# Patient Record
Sex: Female | Born: 1975 | Race: Black or African American | Hispanic: No | Marital: Married | Smoking: Never smoker
Health system: Southern US, Community
[De-identification: ages and names within clinical notes are randomized; demographics above are authoritative.]

## PROBLEM LIST (undated history)

## (undated) DIAGNOSIS — Z789 Other specified health status: Secondary | ICD-10-CM

## (undated) DIAGNOSIS — K649 Unspecified hemorrhoids: Secondary | ICD-10-CM

## (undated) HISTORY — DX: Other specified health status: Z78.9

---

## 2020-07-22 NOTE — Congregational Nurse Program (Signed)
  Dept: [phone]   Congregational Nurse Program Note  Date of Encounter: 07/22/2020  Past Medical History: No past medical history on file.  Encounter Details: New refugee complaining of right shoulder and neck pain.Advised to use warm compresses and to do gentle exercises as well as over the counter pain medications.I will continue to monitor her pain and refer to pcp if needed.  Arman Bogus RN BSn P[facility]N  [facility] [phone]-cell [phone]-office

## 2020-08-12 NOTE — Congregational Nurse Program (Signed)
Patient came in today for BP check and for help setting up PCP appointment with Internal Medicine. She has upcoming appointment with health department on March 11th.   She also continues to have right sided neck pain as well as constipation associated with hemorrhoids.   Will call patient with appointment information. She will need to bring Medicaid Card, ID, and $3 to appointment.    Appointment scheduled for March 2nd, 10:15am.   Arman Bogus RN BSn [place] [facility]regational Nurse [phone]-cell [phone]-office

## 2020-08-20 ENCOUNTER — Encounter: Payer: Self-pay | Admitting: Internal Medicine

## 2020-08-20 ENCOUNTER — Ambulatory Visit (INDEPENDENT_AMBULATORY_CARE_PROVIDER_SITE_OTHER): Payer: Medicaid Other | Admitting: Internal Medicine

## 2020-08-20 VITALS — BP 110/71 | HR 80 | Ht 62.0 in | Wt 224.1 lb

## 2020-08-20 DIAGNOSIS — Z0000 Encounter for general adult medical examination without abnormal findings: Secondary | ICD-10-CM

## 2020-08-20 DIAGNOSIS — S46819A Strain of other muscles, fascia and tendons at shoulder and upper arm level, unspecified arm, initial encounter: Secondary | ICD-10-CM | POA: Insufficient documentation

## 2020-08-20 DIAGNOSIS — R202 Paresthesia of skin: Secondary | ICD-10-CM | POA: Insufficient documentation

## 2020-08-20 DIAGNOSIS — Z1231 Encounter for screening mammogram for malignant neoplasm of breast: Secondary | ICD-10-CM

## 2020-08-20 DIAGNOSIS — S46811A Strain of other muscles, fascia and tendons at shoulder and upper arm level, right arm, initial encounter: Secondary | ICD-10-CM | POA: Diagnosis not present

## 2020-08-20 NOTE — Assessment & Plan Note (Signed)
She has right trapezial soreness that is chronic and not relieved with ibuprofen or tylenol and has been present for more than three months. She is right handed. She has a two year old she doesn't carry a lot but when she does, she will carry her in either arm. The numbness and right trapezial pain do not interfere with getting work done, but do bother her frequently. Tenderness along right trapezius with stiffening.   - ice, heat, tylenol, ibuprofen  - she requests referral to PT  - f/u if symptoms fail to improve

## 2020-08-20 NOTE — Addendum Note (Signed)
Addended by: Bufford Spikes on: 08/20/2020 04:21 PM   Modules accepted: Orders

## 2020-08-20 NOTE — Progress Notes (Signed)
Internal Medicine Clinic Attending  Case discussed with Dr. Seawell  At the time of the visit.  We reviewed the resident's history and exam and pertinent patient test results.  I agree with the assessment, diagnosis, and plan of care documented in the resident's note.  

## 2020-08-20 NOTE — Patient Instructions (Addendum)
Thank you for allowing [place] to provide your care today. Today we discussed your shoulder pain, and tingling in your hands.    Please try to use a wrist splint for your wrists at night.   For your right shoulder, rest this as you can. Alternate ice and heat to the area.  You can also alternate tylenol 500 mg every 8 hours and ibuprofen 400 mg every 8 hours as needed.  Try to minimize Ibuprofen use.   Please follow-up for Pap smear in 1-2 weeks.   Please call the internal medicine center clinic if you have any questions or concerns, we may be able to help and keep you from a long and expensive emergency room wait. Our clinic and after hours phone number is [phone], the best time to call is Monday through Friday 9 am to 4 pm but there is always someone available 24/7 if you have an emergency. If you need medication refills please notify your pharmacy one week in advance and they will send [place] a request.

## 2020-08-20 NOTE — Assessment & Plan Note (Signed)
She has never had a pap smear that she knows of.   - f/u in 1-2 weeks for pap smear - referral sent for mammogram

## 2020-08-20 NOTE — Progress Notes (Signed)
   CC: hand tingling  HPI:  Allison Maddox is a 45 y.o. with PMH as below presenting to establish care  Please see A&P for assessment of the patient's acute and chronic medical conditions.   She has been having tingling in both hands for the past few months, left > right. It is worse at night before bed. The tingling is along the entire hand. She uses her hands a lot throughout the day with lots of lifting, washing dishes, writing. The tingling does not radiate up her arm and is localized to the hands.  She also has right trapezial soreness that is chronic and not relieved with ibuprofen or tylenol and has been present for more than three months. She is right handed. She has a two year old she doesn't carry a lot but when she does, she will carry her in either arm. The numbness and right trapezial pain do not interfere with getting work done, but do bother her frequently.    PMH: No medical history Surgical: c-section 2001, 2003, 2008, 2019.   Medications: She is not taking any medications  Social History: She has four children.  She lives in [place] since December 2021 from [place]  She does not use tobacco She does not drink.  Her children sometimes help at home.  She is here learning english currently.   No past medical history on file.  Review of Systems:   Review of Systems  Constitutional: Negative for chills, fever, malaise/fatigue and weight loss.  HENT: Negative for congestion and sore throat.   Respiratory: Negative for cough, shortness of breath and wheezing.   Cardiovascular: Negative for chest pain, palpitations and leg swelling.  Gastrointestinal: Negative for abdominal pain, constipation, diarrhea and nausea.  Genitourinary: Negative for dysuria, frequency and urgency.  Musculoskeletal: Positive for back pain, myalgias and neck pain. Negative for falls and joint pain.  Neurological: Positive for tingling. Negative for dizziness, focal weakness, weakness and  headaches.  Psychiatric/Behavioral: Negative for depression.   Physical Exam:  Constitution: NAD, appears stated age HENT: no occipital tenderness, [place]/AT  Cardio: RRR, no m/r/g, no LE edema  Respiratory: CTA, no w/r/r Abdominal: NTTP, soft, non-distended MSK: Negative Tinel's and phalen's, negative ulnar nerve test, negative spurling's. Negative allen test for thoracic outlet. Trapezius on right TTP, tight. Full active ROM shoulders bilaterally, strength 5/5 upper extremities bilaterally, sensation intact  Neuro: normal affect, a&ox3 Skin: c/d/i    Vitals:   08/20/20 1035  BP: 110/71  Pulse: 80  SpO2: 100%  Weight: 224 lb 1.6 oz (101.7 kg)  Height: 5\' 2"  (1.575 m)    Assessment & Plan:   See Encounters Tab for problem based charting.  Patient discussed with Dr. 

## 2020-08-20 NOTE — Assessment & Plan Note (Addendum)
She has been having tingling in both hands for the past few months, left > right. It is worse at night before bed. The tingling is along the entire hand. She uses her hands a lot throughout the day with lots of lifting, washing dishes, writing. The tingling does not radiate up her arm and is localized to the hands.  She also has right trapezial soreness that is chronic and not relieved with ibuprofen or tylenol and has been present for more than three months. She is right handed. She has a two year old she doesn't carry a lot but when she does, she will carry her in either arm. The numbness and right trapezial pain do not interfere with getting work done, but do bother her frequently. Negative Tinel's and phalen's, negative ulnar nerve test, negative spurling's. Negative allen test for thoracic outlet.  Symptoms may be  - CBC, BMP, A1C, vitamin B12 - ice, heat, tylenol, ibuprofen  - she requests referral to PT  - recommend wrist splints at night  - f/u if symptoms fail to improve

## 2020-08-26 ENCOUNTER — Encounter: Payer: Self-pay | Admitting: *Deleted

## 2020-08-28 NOTE — Progress Notes (Signed)
  Subjective:    Allison Maddox - 45 y.o. female MRN [id number]  Date of birth: [birth year]  HPI  Allison Maddox is to establish care. Patient has a PMH significant for none.    Current issues and/or concerns: - None  ROS per HPI   Health Maintenance:  Health Maintenance Due  Topic Date Due  . Hepatitis C Screening  Never done  . COVID-19 Vaccine (1) Never done  . HIV Screening  Never done  . TETANUS/TDAP  Never done  . PAP SMEAR-Modifier  Never done  . INFLUENZA VACCINE  Never done    Past Medical History: There are no problems to display for this patient.  Social History   reports that she has never smoked. She has never used smokeless tobacco. She reports that she does not drink alcohol and does not use drugs.   Family History  family history is not on file.   Medications: reviewed and updated   Objective:   Physical Exam BP 103/67 (BP Location: Left Arm, Patient Position: Sitting)   Pulse 69   Ht 5' 2.44" (1.586 m)   Wt 224 lb 3.2 oz (101.7 kg)   SpO2 98%   BMI 40.43 kg/m  Physical Exam Constitutional:      Appearance: She is obese.  HENT:     Head: Normocephalic and atraumatic.  Eyes:     Extraocular Movements: Extraocular movements intact.     Pupils: Pupils are equal, round, and reactive to light.  Cardiovascular:     Rate and Rhythm: Normal rate and regular rhythm.     Pulses: Normal pulses.     Heart sounds: Normal heart sounds.  Pulmonary:     Effort: Pulmonary effort is normal.     Breath sounds: Normal breath sounds.  Musculoskeletal:     Cervical back: Normal range of motion and neck supple.  Neurological:     General: No focal deficit present.     Mental Status: She is alert and oriented to person, place, and time.  Psychiatric:        Mood and Affect: Mood normal.        Behavior: Behavior normal.     Assessment & Plan:  1. Encounter to establish care: - Patient presents today to establish care.  - Return for annual physical  examination, labs, and health maintenance. Arrive fasting meaning having had no food and/or nothing to drink for at least 8 hours prior to appointment.  Please take scheduled medications as normal.  2. Language barrier: - [employer] participated during today's visit. Interpreter Name: Raynelle Fanning, ID#: [id number].   Patient was given clear instructions to go to Emergency Department or return to medical center if symptoms don't improve, worsen, or new problems develop.The patient verbalized understanding.  I discussed the assessment and treatment plan with the patient. The patient was provided an opportunity to ask questions and all were answered. The patient agreed with the plan and demonstrated an understanding of the instructions.   The patient was advised to call back or seek an in-person evaluation if the symptoms worsen or if the condition fails to improve as anticipated.    Ricky Stabs, NP 08/30/2020, 6:56 AM Primary Care at [facility]

## 2020-08-29 ENCOUNTER — Ambulatory Visit (INDEPENDENT_AMBULATORY_CARE_PROVIDER_SITE_OTHER): Payer: Medicaid Other | Admitting: Family

## 2020-08-29 ENCOUNTER — Other Ambulatory Visit: Payer: Self-pay

## 2020-08-29 ENCOUNTER — Encounter: Payer: Self-pay | Admitting: Family

## 2020-08-29 VITALS — BP 103/67 | HR 69 | Ht 62.44 in | Wt 224.2 lb

## 2020-08-29 DIAGNOSIS — Z789 Other specified health status: Secondary | ICD-10-CM

## 2020-08-29 DIAGNOSIS — Z7689 Persons encountering health services in other specified circumstances: Secondary | ICD-10-CM

## 2020-08-29 NOTE — Patient Instructions (Addendum)
Return for annual physical examination, labs, and health maintenance. Arrive fasting meaning having had no food and/or nothing to drink for at least 8 hours prior to appointment.  Please take scheduled medications as normal. Thank you for choosing Primary Care at [facility] for your medical home!    Allison Maddox was seen by Camillia Herter, NP today.   Allison Maddox's primary care provider is Camillia Herter, NP.   For the best care possible,  you should try to see Durene Fruits, NP whenever you come to clinic.   We look forward to seeing you again soon!  If you have any questions about your visit today,  please call us at [phone]  Or feel free to reach your provider via [place].    Preventive Care 51-63 Years Old, Female Preventive care refers to lifestyle choices and visits with your health care provider that can promote health and wellness. This includes:  A yearly physical exam. This is also called an annual wellness visit.  Regular dental and eye exams.  Immunizations.  Screening for certain conditions.  Healthy lifestyle choices, such as: ? Eating a healthy diet. ? Getting regular exercise. ? Not using drugs or products that contain nicotine and tobacco. ? Limiting alcohol use. What can I expect for my preventive care visit? Physical exam Your health care provider will check your:  Height and weight. These may be used to calculate your BMI (body mass index). BMI is a measurement that tells if you are at a healthy weight.  Heart rate and blood pressure.  Body temperature.  Skin for abnormal spots. Counseling Your health care provider may ask you questions about your:  Past medical problems.  Family's medical history.  Alcohol, tobacco, and drug use.  Emotional well-being.  Home life and relationship well-being.  Sexual activity.  Diet, exercise, and sleep habits.  Work and work Statistician.  Access to firearms.  Method of birth  control.  Menstrual cycle.  Pregnancy history. What immunizations do I need? Vaccines are usually given at various ages, according to a schedule. Your health care provider will recommend vaccines for you based on your age, medical history, and lifestyle or other factors, such as travel or where you work.   What tests do I need? Blood tests  Lipid and cholesterol levels. These may be checked every 5 years, or more often if you are over 31 years old.  Hepatitis C test.  Hepatitis B test. Screening  Lung cancer screening. You may have this screening every year starting at age 42 if you have a 30-pack-year history of smoking and currently smoke or have quit within the past 15 years.  Colorectal cancer screening. ? All adults should have this screening starting at age 31 and continuing until age 32. ? Your health care provider may recommend screening at age 52 if you are at increased risk. ? You will have tests every 1-10 years, depending on your results and the type of screening test.  Diabetes screening. ? This is done by checking your blood sugar (glucose) after you have not eaten for a while (fasting). ? You may have this done every 1-3 years.  Mammogram. ? This may be done every 1-2 years. ? Talk with your health care provider about when you should start having regular mammograms. This may depend on whether you have a family history of breast cancer.  BRCA-related cancer screening. This may be done if you have a family history of breast, ovarian, tubal,  or peritoneal cancers.  Pelvic exam and Pap test. ? This may be done every 3 years starting at age 58. ? Starting at age 77, this may be done every 5 years if you have a Pap test in combination with an HPV test. Other tests  STD (sexually transmitted disease) testing, if you are at risk.  Bone density scan. This is done to screen for osteoporosis. You may have this scan if you are at high risk for osteoporosis. Talk with your  health care provider about your test results, treatment options, and if necessary, the need for more tests. Follow these instructions at home: Eating and drinking  Eat a diet that includes fresh fruits and vegetables, whole grains, lean protein, and low-fat dairy products.  Take vitamin and mineral supplements as recommended by your health care provider.  Do not drink alcohol if: ? Your health care provider tells you not to drink. ? You are pregnant, may be pregnant, or are planning to become pregnant.  If you drink alcohol: ? Limit how much you have to 0-1 drink a day. ? Be aware of how much alcohol is in your drink. In the [place] one drink equals one 12 oz bottle of beer (355 mL), one 5 oz glass of wine (148 mL), or one 1 oz glass of hard liquor (44 mL).   Lifestyle  Take daily care of your teeth and gums. Brush your teeth every morning and night with fluoride toothpaste. Floss one time each day.  Stay active. Exercise for at least 30 minutes 5 or more days each week.  Do not use any products that contain nicotine or tobacco, such as cigarettes, e-cigarettes, and chewing tobacco. If you need help quitting, ask your health care provider.  Do not use drugs.  If you are sexually active, practice safe sex. Use a condom or other form of protection to prevent STIs (sexually transmitted infections).  If you do not wish to become pregnant, use a form of birth control. If you plan to become pregnant, see your health care provider for a prepregnancy visit.  If told by your health care provider, take low-dose aspirin daily starting at age 69.  Find healthy ways to cope with stress, such as: ? Meditation, yoga, or listening to music. ? Journaling. ? Talking to a trusted person. ? Spending time with friends and family. Safety  Always wear your seat belt while driving or riding in a vehicle.  Do not drive: ? If you have been drinking alcohol. Do not ride with someone who has been  drinking. ? When you are tired or distracted. ? While texting.  Wear a helmet and other protective equipment during sports activities.  If you have firearms in your house, make sure you follow all gun safety procedures. What's next?  Visit your health care provider once a year for an annual wellness visit.  Ask your health care provider how often you should have your eyes and teeth checked.  Stay up to date on all vaccines. This information is not intended to replace advice given to you by your health care provider. Make sure you discuss any questions you have with your health care provider. Document Revised: 03/11/2020 Document Reviewed: 02/16/2018 Elsevier Patient Education  2021 [employer].

## 2020-08-29 NOTE — Progress Notes (Signed)
Establish care Refugees 3 months

## 2020-09-01 ENCOUNTER — Encounter: Payer: Self-pay | Admitting: Internal Medicine

## 2020-09-03 ENCOUNTER — Encounter: Payer: Medicaid Other | Admitting: Internal Medicine

## 2020-09-12 ENCOUNTER — Other Ambulatory Visit: Payer: Self-pay

## 2020-09-12 ENCOUNTER — Ambulatory Visit (INDEPENDENT_AMBULATORY_CARE_PROVIDER_SITE_OTHER): Payer: Medicaid Other | Admitting: Family

## 2020-09-12 VITALS — BP 108/76 | HR 79 | Ht 62.44 in | Wt 223.8 lb

## 2020-09-12 DIAGNOSIS — Z130 Encounter for screening for diseases of the blood and blood-forming organs and certain disorders involving the immune mechanism: Secondary | ICD-10-CM | POA: Diagnosis not present

## 2020-09-12 DIAGNOSIS — Z131 Encounter for screening for diabetes mellitus: Secondary | ICD-10-CM | POA: Diagnosis not present

## 2020-09-12 DIAGNOSIS — Z1329 Encounter for screening for other suspected endocrine disorder: Secondary | ICD-10-CM

## 2020-09-12 DIAGNOSIS — Z124 Encounter for screening for malignant neoplasm of cervix: Secondary | ICD-10-CM

## 2020-09-12 DIAGNOSIS — R200 Anesthesia of skin: Secondary | ICD-10-CM

## 2020-09-12 DIAGNOSIS — Z1159 Encounter for screening for other viral diseases: Secondary | ICD-10-CM

## 2020-09-12 DIAGNOSIS — Z13220 Encounter for screening for lipoid disorders: Secondary | ICD-10-CM

## 2020-09-12 DIAGNOSIS — Z13228 Encounter for screening for other metabolic disorders: Secondary | ICD-10-CM

## 2020-09-12 DIAGNOSIS — Z789 Other specified health status: Secondary | ICD-10-CM

## 2020-09-12 DIAGNOSIS — Z0000 Encounter for general adult medical examination without abnormal findings: Secondary | ICD-10-CM | POA: Diagnosis not present

## 2020-09-12 DIAGNOSIS — K649 Unspecified hemorrhoids: Secondary | ICD-10-CM

## 2020-09-12 DIAGNOSIS — M542 Cervicalgia: Secondary | ICD-10-CM

## 2020-09-12 DIAGNOSIS — Z114 Encounter for screening for human immunodeficiency virus [HIV]: Secondary | ICD-10-CM

## 2020-09-12 MED ORDER — GABAPENTIN 100 MG PO CAPS: 100.0000 mg | ORAL_CAPSULE | Freq: Every day | ORAL | 0 refills | Status: DC

## 2020-09-12 MED ORDER — MELOXICAM 7.5 MG PO TABS: 7.5000 mg | ORAL_TABLET | Freq: Every day | ORAL | 0 refills | Status: DC

## 2020-09-12 MED ORDER — HYDROCORTISONE (PERIANAL) 2.5 % EX CREA: 1.0000 "application " | TOPICAL_CREAM | Freq: Two times a day (BID) | CUTANEOUS | 1 refills | Status: DC

## 2020-09-12 MED ORDER — GABAPENTIN 100 MG PO CAPS: 100.0000 mg | ORAL_CAPSULE | Freq: Every day | ORAL | 0 refills | Status: AC

## 2020-09-12 NOTE — Progress Notes (Signed)
Physical Hand/feet cramping for 3 weeks  Any prescriptions need to be printed-no pharmacy

## 2020-09-12 NOTE — Progress Notes (Signed)
Patient ID: Allison Maddox, female    DOB: 1975/12/22  MRN: [id number]  CC: Annual Exam  Subjective: Allison Maddox is a 45 y.o. female who presents for annual physical exam. Her concerns today include:   Bilateral hand numbness and neck pain. Both described as ongoing. Denies trauma and radiation of hands and neck.   Concern of feeling something at her anus. Denies dark stool and blood in stool. Reports having menses today and declined examination.    Patient Active Problem List   Diagnosis Date Noted  . Hand tingling 08/20/2020  . Trapezius muscle strain 08/20/2020  . Healthcare maintenance 08/20/2020     No current outpatient medications on file prior to visit.   No current facility-administered medications on file prior to visit.    No Known Allergies  Social History   Socioeconomic History  . Marital status: Married    Spouse name: Not on file  . Number of children: Not on file  . Years of education: Not on file  . Highest education level: Not on file  Occupational History  . Not on file  Tobacco Use  . Smoking status: Never Smoker  . Smokeless tobacco: Never Used  Vaping Use  . Vaping Use: Never used  Substance and Sexual Activity  . Alcohol use: Never  . Drug use: Never  . Sexual activity: Yes    Birth control/protection: None  Other Topics Concern  . Not on file  Social History Narrative   ** Merged History Encounter **       Social Determinants of Health   Financial Resource Strain: Not on file  Food Insecurity: Not on file  Transportation Needs: Not on file  Physical Activity: Not on file  Stress: Not on file  Social Connections: Not on file  Intimate Partner Violence: Not on file    No family history on file.  Past Surgical History:  Procedure Laterality Date  . CESAREAN SECTION  2001, 2003, 2008, and 2019  . CESAREAN SECTION      ROS: Review of Systems Negative except as stated above  PHYSICAL EXAM: BP 108/76 (BP Location:  Left Arm, Patient Position: Sitting)   Pulse 79   Ht 5' 2.44" (1.586 m)   Wt 223 lb 12.8 oz (101.5 kg)   SpO2 97%   BMI 40.36 kg/m    Wt Readings from Last 3 Encounters:  09/12/20 223 lb 12.8 oz (101.5 kg)  08/29/20 224 lb 3.2 oz (101.7 kg)  08/20/20 224 lb 1.6 oz (101.7 kg)    Physical Exam Exam conducted with a chaperone present.  Constitutional:      Appearance: She is obese.  HENT:     Head: Normocephalic and atraumatic.     Right Ear: Tympanic membrane, ear canal and external ear normal.     Left Ear: Tympanic membrane, ear canal and external ear normal.     Nose: Nose normal.     Mouth/Throat:     Mouth: Mucous membranes are moist.     Pharynx: Oropharynx is clear.  Eyes:     Extraocular Movements: Extraocular movements intact.     Conjunctiva/sclera: Conjunctivae normal.     Pupils: Pupils are equal, round, and reactive to light.  Cardiovascular:     Rate and Rhythm: Normal rate and regular rhythm.     Pulses: Normal pulses.     Heart sounds: Normal heart sounds.  Pulmonary:     Effort: Pulmonary effort is normal.     Breath  sounds: Normal breath sounds.  Chest:  Breasts:     Right: Normal.     Left: Normal.      Comments: Allison Maddox, CMA present during examination.  Abdominal:     General: Bowel sounds are normal.     Palpations: Abdomen is soft.  Genitourinary:    Comments: Patient declined examination.  Musculoskeletal:        General: Normal range of motion.     Cervical back: Normal range of motion and neck supple.  Skin:    General: Skin is warm and dry.     Capillary Refill: Capillary refill takes less than 2 seconds.  Neurological:     General: No focal deficit present.     Mental Status: She is alert and oriented to person, place, and time.  Psychiatric:        Mood and Affect: Mood normal.        Behavior: Behavior normal.     ASSESSMENT AND PLAN: 1. Annual physical exam: - Counseled on 150 minutes of exercise per week as  tolerated, healthy eating (including decreased daily intake of saturated fats, cholesterol, added sugars, sodium), STI prevention, and routine healthcare maintenance.  2. Screening for metabolic disorder: - CMP to check kidney function, liver function, and electrolyte balance.  - Comprehensive metabolic panel  3. Screening for deficiency anemia: - CBC to screen for anemia. - CBC  4. Diabetes mellitus screening: - Hemoglobin A1c to screen for pre-diabetes/diabetes. - Hemoglobin A1c  5. Screening cholesterol level: - Lipid panel to screen for high cholesterol.  - Lipid panel  6. Thyroid disorder screen: - TSH to check thyroid function.  - TSH+T4F+T3Free  7. Need for hepatitis C screening test: - Hepatitis C antibody to screen for hepatitis C.  - Hepatitis C Antibody  8. Encounter for screening for HIV: - HIV antibody to screen for human immunodeficiency virus.  - HIV antibody (with reflex)  9. Pap smear for cervical cancer screening: - Referral to Gynecology for cervical cancer screening by PAP smear.  - Ambulatory referral to Gynecology  10. Hand numbness: - Gabapentin as prescribed.  -Follow-up with primary provider as scheduled. - gabapentin (NEURONTIN) 100 MG capsule; Take 1 capsule (100 mg total) by mouth at bedtime.  Dispense: 30 capsule; Refill: 0  11. Neck pain: - Meloxicam as prescribed.  - Follow-up with primary provider as scheduled.  - meloxicam (MOBIC) 7.5 MG tablet; Take 1 tablet (7.5 mg total) by mouth daily.  Dispense: 30 tablet; Refill: 0  12. Hemorrhoids, unspecified hemorrhoid type: - Hydrocortisone rectal cream as prescribed.  - Follow-up with primary provider as scheduled. - hydrocortisone (ANUSOL-HC) 2.5 % rectal cream; Place 1 application rectally 2 (two) times daily.  Dispense: 30 g; Refill: 1  13. Language barrier: - [employer] participated during today's visit. Interpreter Name: Truman Hayward, ID#: [id number].   Patient was given the  opportunity to ask questions.  Patient verbalized understanding of the plan and was able to repeat key elements of the plan. Patient was given clear instructions to go to Emergency Department or return to medical center if symptoms don't improve, worsen, or new problems develop.The patient verbalized understanding.   Orders Placed This Encounter  Procedures  . Hepatitis C Antibody  . HIV antibody (with reflex)  . CBC  . Comprehensive metabolic panel  . Lipid panel  . TSH+T4F+T3Free  . Hemoglobin A1c  . Ambulatory referral to Gynecology    Requested Prescriptions   Signed Prescriptions Disp Refills  . gabapentin (  NEURONTIN) 100 MG capsule 30 capsule 0    Sig: Take 1 capsule (100 mg total) by mouth at bedtime.  . meloxicam (MOBIC) 7.5 MG tablet 30 tablet 0    Sig: Take 1 tablet (7.5 mg total) by mouth daily.  . hydrocortisone (ANUSOL-HC) 2.5 % rectal cream 30 g 1    Sig: Place 1 application rectally 2 (two) times daily.    Follow-up with primary provider as scheduled.  [name], NP

## 2020-09-12 NOTE — Patient Instructions (Addendum)
Annual physical exam and labs today.   Meloxicam for neck pain.  Gabapentin for hand pain.   Anusol for hemorrhoids.   Follow-up with primary provider as scheduled.   Preventive Care 27-44 Years Old, Female Preventive care refers to lifestyle choices and visits with your health care provider that can promote health and wellness. This includes:  A yearly physical exam. This is also called an annual wellness visit.  Regular dental and eye exams.  Immunizations.  Screening for certain conditions.  Healthy lifestyle choices, such as: ? Eating a healthy diet. ? Getting regular exercise. ? Not using drugs or products that contain nicotine and tobacco. ? Limiting alcohol use. What can I expect for my preventive care visit? Physical exam Your health care provider will check your:  Height and weight. These may be used to calculate your BMI (body mass index). BMI is a measurement that tells if you are at a healthy weight.  Heart rate and blood pressure.  Body temperature.  Skin for abnormal spots. Counseling Your health care provider may ask you questions about your:  Past medical problems.  Family's medical history.  Alcohol, tobacco, and drug use.  Emotional well-being.  Home life and relationship well-being.  Sexual activity.  Diet, exercise, and sleep habits.  Work and work Statistician.  Access to firearms.  Method of birth control.  Menstrual cycle.  Pregnancy history. What immunizations do I need? Vaccines are usually given at various ages, according to a schedule. Your health care provider will recommend vaccines for you based on your age, medical history, and lifestyle or other factors, such as travel or where you work.   What tests do I need? Blood tests  Lipid and cholesterol levels. These may be checked every 5 years, or more often if you are over 81 years old.  Hepatitis C test.  Hepatitis B test. Screening  Lung cancer screening. You may  have this screening every year starting at age 63 if you have a 30-pack-year history of smoking and currently smoke or have quit within the past 15 years.  Colorectal cancer screening. ? All adults should have this screening starting at age 55 and continuing until age 22. ? Your health care provider may recommend screening at age 25 if you are at increased risk. ? You will have tests every 1-10 years, depending on your results and the type of screening test.  Diabetes screening. ? This is done by checking your blood sugar (glucose) after you have not eaten for a while (fasting). ? You may have this done every 1-3 years.  Mammogram. ? This may be done every 1-2 years. ? Talk with your health care provider about when you should start having regular mammograms. This may depend on whether you have a family history of breast cancer.  BRCA-related cancer screening. This may be done if you have a family history of breast, ovarian, tubal, or peritoneal cancers.  Pelvic exam and Pap test. ? This may be done every 3 years starting at age 34. ? Starting at age 18, this may be done every 5 years if you have a Pap test in combination with an HPV test. Other tests  STD (sexually transmitted disease) testing, if you are at risk.  Bone density scan. This is done to screen for osteoporosis. You may have this scan if you are at high risk for osteoporosis. Talk with your health care provider about your test results, treatment options, and if necessary, the need for more  tests. Follow these instructions at home: Eating and drinking  Eat a diet that includes fresh fruits and vegetables, whole grains, lean protein, and low-fat dairy products.  Take vitamin and mineral supplements as recommended by your health care provider.  Do not drink alcohol if: ? Your health care provider tells you not to drink. ? You are pregnant, may be pregnant, or are planning to become pregnant.  If you drink  alcohol: ? Limit how much you have to 0-1 drink a day. ? Be aware of how much alcohol is in your drink. In the [place] one drink equals one 12 oz bottle of beer (355 mL), one 5 oz glass of wine (148 mL), or one 1 oz glass of hard liquor (44 mL).   Lifestyle  Take daily care of your teeth and gums. Brush your teeth every morning and night with fluoride toothpaste. Floss one time each day.  Stay active. Exercise for at least 30 minutes 5 or more days each week.  Do not use any products that contain nicotine or tobacco, such as cigarettes, e-cigarettes, and chewing tobacco. If you need help quitting, ask your health care provider.  Do not use drugs.  If you are sexually active, practice safe sex. Use a condom or other form of protection to prevent STIs (sexually transmitted infections).  If you do not wish to become pregnant, use a form of birth control. If you plan to become pregnant, see your health care provider for a prepregnancy visit.  If told by your health care provider, take low-dose aspirin daily starting at age 52.  Find healthy ways to cope with stress, such as: ? Meditation, yoga, or listening to music. ? Journaling. ? Talking to a trusted person. ? Spending time with friends and family. Safety  Always wear your seat belt while driving or riding in a vehicle.  Do not drive: ? If you have been drinking alcohol. Do not ride with someone who has been drinking. ? When you are tired or distracted. ? While texting.  Wear a helmet and other protective equipment during sports activities.  If you have firearms in your house, make sure you follow all gun safety procedures. What's next?  Visit your health care provider once a year for an annual wellness visit.  Ask your health care provider how often you should have your eyes and teeth checked.  Stay up to date on all vaccines. This information is not intended to replace advice given to you by your health care provider. Make  sure you discuss any questions you have with your health care provider. Document Revised: 03/11/2020 Document Reviewed: 02/16/2018 Elsevier Patient Education  2021 [employer].

## 2020-09-13 LAB — COMPREHENSIVE METABOLIC PANEL
ALT: 13 IU/L (ref 0–32)
AST: 21 IU/L (ref 0–40)
Albumin/Globulin Ratio: 1.3 (ref 1.2–2.2)
Albumin: 4 g/dL (ref 3.8–4.8)
Alkaline Phosphatase: 40 IU/L — ABNORMAL LOW (ref 44–121)
BUN/Creatinine Ratio: 20 (ref 9–23)
BUN: 14 mg/dL (ref 6–24)
Bilirubin Total: <0.2 mg/dL (ref 0.0–1.2)
CO2: 20 mmol/L (ref 20–29)
Calcium: 9.4 mg/dL (ref 8.7–10.2)
Chloride: 107 mmol/L — ABNORMAL HIGH (ref 96–106)
Creatinine, Ser: 0.7 mg/dL (ref 0.57–1.00)
Globulin, Total: 3 g/dL (ref 1.5–4.5)
Glucose: 80 mg/dL (ref 65–99)
Potassium: 4.7 mmol/L (ref 3.5–5.2)
Sodium: 143 mmol/L (ref 134–144)
Total Protein: 7 g/dL (ref 6.0–8.5)
eGFR: 110 mL/min/{1.73_m2} (ref >59)

## 2020-09-13 LAB — HIV ANTIBODY (ROUTINE TESTING W REFLEX): HIV Screen 4th Generation wRfx: NONREACTIVE

## 2020-09-13 LAB — CBC
Hematocrit: 38.9 % (ref 34.0–46.6)
Hemoglobin: 12.4 g/dL (ref 11.1–15.9)
MCH: 26.4 pg — ABNORMAL LOW (ref 26.6–33.0)
MCHC: 31.9 g/dL (ref 31.5–35.7)
MCV: 83 fL (ref 79–97)
Platelets: 262 10*3/uL (ref 150–450)
RBC: 4.69 x10E6/uL (ref 3.77–5.28)
RDW: 14.6 % (ref 11.7–15.4)
WBC: 7.2 10*3/uL (ref 3.4–10.8)

## 2020-09-13 LAB — HEMOGLOBIN A1C
Est. average glucose Bld gHb Est-mCnc: 117 mg/dL
Hgb A1c MFr Bld: 5.7 % — ABNORMAL HIGH (ref 4.8–5.6)

## 2020-09-13 LAB — LIPID PANEL
Chol/HDL Ratio: 3 ratio (ref 0.0–4.4)
Cholesterol, Total: 207 mg/dL — ABNORMAL HIGH (ref 100–199)
HDL: 69 mg/dL (ref >39)
LDL Chol Calc (NIH): 127 mg/dL — ABNORMAL HIGH (ref 0–99)
Triglycerides: 63 mg/dL (ref 0–149)
VLDL Cholesterol Cal: 11 mg/dL (ref 5–40)

## 2020-09-13 LAB — TSH+T4F+T3FREE
Free T4: 1.03 ng/dL (ref 0.82–1.77)
T3, Free: 2.7 pg/mL (ref 2.0–4.4)
TSH: 1.7 u[IU]/mL (ref 0.450–4.500)

## 2020-09-13 LAB — HEPATITIS C ANTIBODY: Hep C Virus Ab: 0.1 s/co ratio (ref 0.0–0.9)

## 2020-09-13 NOTE — Progress Notes (Signed)
Kidney function normal.   Liver function normal.   Thyroid normal.   Hepatitis C negative.   HIV negative.   No anemia.  Your hemoglobin A1c is consistent with pre-diabetes. Practice healthy eating habits of fresh fruit and vegetables, lean baked meats such as chicken, fish, and [place]; limit breads, rice, pastas, and desserts; practice regular aerobic exercise (at least 150 minutes a week as tolerated). Patient encouraged to have rechecked in 6 months or sooner if needed.   Cholesterol higher than expected. High cholesterol may increase risk of heart attack and/or stroke. Consider eating more fruits, vegetables, and lean baked meats such as chicken or fish. Moderate intensity exercise at least 150 minutes as tolerated per week may help as well. Patient encouraged to have rechecked in 6 months or sooner if needed.  The following is for provider reference only: The 10-year ASCVD risk score Denman George DC Montez Hageman., et al., 2013) is: 0.2%   Values used to calculate the score:     Age: 45 years     Sex: Female     Is Non-Hispanic African American: Yes     Diabetic: No     Tobacco smoker: No     Systolic Blood Pressure: 108 mmHg     Is BP treated: No     HDL Cholesterol: 69 mg/dL     Total Cholesterol: 207 mg/dL

## 2020-09-24 NOTE — Addendum Note (Signed)
Addended by: Neomia Dear on: 09/24/2020 04:04 PM   Modules accepted: Orders

## 2020-10-02 ENCOUNTER — Ambulatory Visit
Admission: RE | Admit: 2020-10-02 | Discharge: 2020-10-02 | Disposition: A | Discharge status: Home / Self Care | Payer: Self-pay | Source: Ambulatory Visit | Attending: Obstetrics and Gynecology | Admitting: Obstetrics and Gynecology

## 2020-10-02 ENCOUNTER — Other Ambulatory Visit: Payer: Self-pay

## 2020-10-02 ENCOUNTER — Other Ambulatory Visit: Payer: Self-pay | Admitting: Obstetrics and Gynecology

## 2020-10-02 DIAGNOSIS — R7611 Nonspecific reaction to tuberculin skin test without active tuberculosis: Secondary | ICD-10-CM

## 2020-10-08 ENCOUNTER — Encounter: Payer: Self-pay | Admitting: Student

## 2020-10-08 ENCOUNTER — Ambulatory Visit (INDEPENDENT_AMBULATORY_CARE_PROVIDER_SITE_OTHER): Payer: Medicaid Other | Admitting: Student

## 2020-10-08 ENCOUNTER — Other Ambulatory Visit: Payer: Self-pay

## 2020-10-08 NOTE — Progress Notes (Signed)
Patient presents for pap smear but is bleeding. Will cancel this visit & reschedule for next week.   Judeth Horn, NP

## 2020-10-13 ENCOUNTER — Ambulatory Visit
Admission: RE | Admit: 2020-10-13 | Discharge: 2020-10-13 | Disposition: A | Discharge status: Home / Self Care | Payer: No Typology Code available for payment source | Source: Ambulatory Visit | Attending: Obstetrics and Gynecology | Admitting: Obstetrics and Gynecology

## 2020-10-13 ENCOUNTER — Other Ambulatory Visit: Payer: Self-pay | Admitting: Obstetrics and Gynecology

## 2020-10-13 DIAGNOSIS — R9389 Abnormal findings on diagnostic imaging of other specified body structures: Secondary | ICD-10-CM

## 2020-10-17 ENCOUNTER — Telehealth: Payer: Self-pay | Admitting: Certified Nurse Midwife

## 2020-10-17 ENCOUNTER — Ambulatory Visit: Payer: No Typology Code available for payment source | Admitting: Obstetrics and Gynecology

## 2020-10-17 NOTE — Telephone Encounter (Signed)
Attempted to reach patient about her appointment. Left a voice message with Interpreter [phone]. Then spoke to her husband, and left new appointment with him.

## 2020-10-31 ENCOUNTER — Encounter: Payer: Self-pay | Admitting: Certified Nurse Midwife

## 2020-10-31 ENCOUNTER — Ambulatory Visit (INDEPENDENT_AMBULATORY_CARE_PROVIDER_SITE_OTHER): Payer: Medicaid Other | Admitting: Certified Nurse Midwife

## 2020-10-31 ENCOUNTER — Other Ambulatory Visit: Payer: Self-pay

## 2020-10-31 ENCOUNTER — Other Ambulatory Visit (HOSPITAL_COMMUNITY)
Admission: RE | Admit: 2020-10-31 | Discharge: 2020-10-31 | Disposition: A | Discharge status: Home / Self Care | Payer: Medicaid Other | Source: Ambulatory Visit | Attending: Certified Nurse Midwife | Admitting: Certified Nurse Midwife

## 2020-10-31 VITALS — BP 130/67 | HR 92 | Temp 98.1°F | Ht 64.96 in | Wt 224.0 lb

## 2020-10-31 DIAGNOSIS — Z124 Encounter for screening for malignant neoplasm of cervix: Secondary | ICD-10-CM | POA: Insufficient documentation

## 2020-10-31 DIAGNOSIS — Z01419 Encounter for gynecological examination (general) (routine) without abnormal findings: Secondary | ICD-10-CM | POA: Diagnosis not present

## 2020-10-31 NOTE — Progress Notes (Signed)
GYNECOLOGY CLINIC ANNUAL PREVENTATIVE CARE ENCOUNTER NOTE  Subjective:   Allison Maddox is a 45 y.o. [phone] female here for a routine annual gynecologic exam.  Current complaints: None. Denies abnormal vaginal bleeding, discharge, pelvic pain, problems with intercourse or other gynecologic concerns.    [place] interpreter present via video for interpretation services, but pt speaks and understands a good amount of [place].  Gynecologic History Patient's last menstrual period was 10/07/2020 (exact date). Contraception: NFP Last Pap: 2019. Results were: normal Last mammogram: None, ordered by PCP  Obstetric History OB History  Gravida Para Term Preterm AB Living  4 3 3     4   SAB IAB Ectopic Multiple Live Births          4    # Outcome Date GA Lbr Len/2nd Weight Sex Delivery Anes PTL Lv  4 Term 09/29/17 [redacted]w[redacted]d  6 lb 2.8 oz (2.8 kg) F CS-LTranv   LIV  3 Term 06/12/07 [redacted]w[redacted]d  7 lb 15 oz (3.6 kg) M CS-LTranv   LIV  2 Term 09/06/01   8 lb 6 oz (3.8 kg) M CS-LTranv   LIV  1 Gravida 02/01/00   8 lb 4.3 oz (3.75 kg) M CS-LTranv   LIV   Past Medical History:  Diagnosis Date  . No pertinent past medical history    Past Surgical History:  Procedure Laterality Date  . CESAREAN SECTION  2001, 2003, 2008, and 2019  . CESAREAN SECTION     Current Outpatient Medications on File Prior to Visit  Medication Sig Dispense Refill  . gabapentin (NEURONTIN) 100 MG capsule Take 1 capsule (100 mg total) by mouth at bedtime. (Patient not taking: Reported on 10/08/2020) 30 capsule 0  . hydrocortisone (ANUSOL-HC) 2.5 % rectal cream Place 1 application rectally 2 (two) times daily. (Patient not taking: Reported on 10/08/2020) 30 g 1  . meloxicam (MOBIC) 7.5 MG tablet Take 1 tablet (7.5 mg total) by mouth daily. (Patient not taking: Reported on 10/08/2020) 30 tablet 0   No current facility-administered medications on file prior to visit.   No Known Allergies  Social History   Socioeconomic History   . Marital status: Married    Spouse name: Not on file  . Number of children: Not on file  . Years of education: Not on file  . Highest education level: Not on file  Occupational History  . Not on file  Tobacco Use  . Smoking status: Never Smoker  . Smokeless tobacco: Never Used  Vaping Use  . Vaping Use: Never used  Substance and Sexual Activity  . Alcohol use: Never  . Drug use: Never  . Sexual activity: Not Currently    Birth control/protection: None  Other Topics Concern  . Not on file  Social History Narrative   ** Merged History Encounter **       Social Determinants of Health   Financial Resource Strain: Not on file  Food Insecurity: Not on file  Transportation Needs: Not on file  Physical Activity: Not on file  Stress: Not on file  Social Connections: Not on file  Intimate Partner Violence: Not on file   No family history on file.  The following portions of the patient's history were reviewed and updated as appropriate: allergies, current medications, past family history, past medical history, past social history, past surgical history and problem list.  Review of Systems Pertinent items noted in HPI and remainder of comprehensive ROS otherwise negative.   Objective:  BP 130/67 (  BP Location: Left Arm, Patient Position: Sitting, Cuff Size: Large)   Pulse 92   Temp 98.1 F (36.7 C) (Oral)   Ht 5' 4.96" (1.65 m)   Wt 224 lb (101.6 kg)   LMP 10/07/2020 (Exact Date)   BMI 37.32 kg/m  CONSTITUTIONAL: Well-developed, well-nourished female in no acute distress.  HENT:  Normocephalic, atraumatic, External right and left ear normal. Oropharynx is clear and moist EYES: Conjunctivae and EOM are normal. Pupils are equal, round, and reactive to light. No scleral icterus.  NECK: Normal range of motion, supple, no masses.  Normal thyroid.  SKIN: Skin is warm and dry. No rash noted. Not diaphoretic. No erythema. No pallor. NEUROLGIC: Alert and oriented to person, place,  and time. Normal reflexes, muscle tone coordination. No cranial nerve deficit noted. PSYCHIATRIC: Normal mood and affect. Normal behavior. Normal judgment and thought content. CARDIOVASCULAR: Normal heart rate noted, regular rhythm RESPIRATORY: Clear to auscultation bilaterally. Effort and breath sounds normal, no problems with respiration noted. BREASTS: Symmetric in size. No masses, skin changes, nipple drainage, or lymphadenopathy. ABDOMEN: Soft, normal bowel sounds, no distention noted.  No tenderness, rebound or guarding.  PELVIC: Normal appearing external genitalia; normal appearing vaginal mucosa and cervix.  No abnormal discharge noted.  Pap smear obtained.  Normal uterine size, no other palpable masses, no uterine or adnexal tenderness. MUSCULOSKELETAL: Normal range of motion. No tenderness.  No cyanosis, clubbing, or edema.  2+ distal pulses.  Assessment:  Annual gynecologic examination with pap smear   Plan:  Will follow up results of pap smear and manage accordingly. Mammogram ordered Routine preventative health maintenance measures emphasized. Please refer to After Visit Summary for other counseling recommendations.    Bernerd Limbo, CNM

## 2020-11-05 LAB — CYTOLOGY - PAP
Comment: NEGATIVE
Diagnosis: NEGATIVE
High risk HPV: NEGATIVE

## 2021-05-04 ENCOUNTER — Ambulatory Visit (INDEPENDENT_AMBULATORY_CARE_PROVIDER_SITE_OTHER): Payer: Medicaid Other

## 2021-05-04 ENCOUNTER — Encounter: Payer: Self-pay | Admitting: Nurse Practitioner

## 2021-05-04 ENCOUNTER — Ambulatory Visit (INDEPENDENT_AMBULATORY_CARE_PROVIDER_SITE_OTHER): Payer: Medicaid Other | Admitting: Nurse Practitioner

## 2021-05-04 ENCOUNTER — Other Ambulatory Visit: Payer: Self-pay

## 2021-05-04 ENCOUNTER — Other Ambulatory Visit: Payer: Self-pay | Admitting: Family

## 2021-05-04 VITALS — BP 94/89 | HR 87

## 2021-05-04 DIAGNOSIS — R0781 Pleurodynia: Secondary | ICD-10-CM

## 2021-05-04 DIAGNOSIS — M25562 Pain in left knee: Secondary | ICD-10-CM | POA: Diagnosis not present

## 2021-05-04 DIAGNOSIS — M25512 Pain in left shoulder: Secondary | ICD-10-CM | POA: Diagnosis not present

## 2021-05-04 DIAGNOSIS — Z124 Encounter for screening for malignant neoplasm of cervix: Secondary | ICD-10-CM

## 2021-05-04 MED ORDER — NAPROXEN 500 MG PO TABS: 500.0000 mg | ORAL_TABLET | Freq: Two times a day (BID) | ORAL | 0 refills | Status: AC

## 2021-05-04 MED ORDER — TIZANIDINE HCL 4 MG PO TABS: 4.0000 mg | ORAL_TABLET | Freq: Four times a day (QID) | ORAL | 0 refills | Status: AC | PRN
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
## 2021-05-04 NOTE — Assessment & Plan Note (Signed)
Left shoulder pain Left rib pain Left knee pain:  Will order muscle relaxer  Will order Prescription naproxen  May alternate heat and ice  Follow up:  Follow up if needed

## 2021-05-11 ENCOUNTER — Telehealth: Payer: Self-pay | Admitting: *Deleted

## 2021-05-11 NOTE — Telephone Encounter (Signed)
-----   Message from Ivonne Andrew, NP sent at 05/11/2021  8:54 AM EST ----- Please try to call Patient using [place] interpreter with results. I have attempted to call, but have not been able to reach her. 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 Please have her follow up if pain persist because she may need referral to ortho. Thanks.

## 2021-05-11 NOTE — Telephone Encounter (Signed)
Medical Assistant used [employer] to contact patient.  Interpreter Name: Kathy Breach Interpreter #: [id number] Per signed DPR patient is aware via VM that xrays showed no new changes. Patient aware of being referred to ortho if pain persist. Patient advised to return a call to [phone].
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
## 2021-05-20 ENCOUNTER — Ambulatory Visit: Payer: Medicaid Other | Admitting: Family

## 2021-09-19 ENCOUNTER — Emergency Department (HOSPITAL_COMMUNITY)
Admission: EM | Admit: 2021-09-19 | Discharge: 2021-09-19 | Disposition: A | Discharge status: Home / Self Care | Payer: Medicaid Other | Attending: Emergency Medicine | Admitting: Emergency Medicine

## 2021-09-19 ENCOUNTER — Encounter (HOSPITAL_COMMUNITY): Payer: Self-pay | Admitting: Emergency Medicine

## 2021-09-19 ENCOUNTER — Emergency Department (HOSPITAL_COMMUNITY): Payer: Medicaid Other

## 2021-09-19 ENCOUNTER — Encounter: Payer: Self-pay | Admitting: Emergency Medicine

## 2021-09-19 ENCOUNTER — Other Ambulatory Visit: Payer: Self-pay

## 2021-09-19 ENCOUNTER — Ambulatory Visit
Admission: EM | Admit: 2021-09-19 | Discharge: 2021-09-19 | Disposition: A | Discharge status: Home / Self Care | Payer: Medicaid Other

## 2021-09-19 DIAGNOSIS — K6289 Other specified diseases of anus and rectum: Secondary | ICD-10-CM

## 2021-09-19 DIAGNOSIS — K649 Unspecified hemorrhoids: Secondary | ICD-10-CM | POA: Diagnosis not present

## 2021-09-19 HISTORY — DX: Unspecified hemorrhoids: K64.9

## 2021-09-19 LAB — CBC WITH DIFFERENTIAL/PLATELET
Abs Immature Granulocytes: 0 10*3/uL (ref 0.00–0.07)
Basophils Absolute: 0.1 10*3/uL (ref 0.0–0.1)
Basophils Relative: 1 %
Eosinophils Absolute: 0.2 10*3/uL (ref 0.0–0.5)
Eosinophils Relative: 3 %
HCT: 38.7 % (ref 36.0–46.0)
Hemoglobin: 12.4 g/dL (ref 12.0–15.0)
Lymphocytes Relative: 40 %
Lymphs Abs: 3 10*3/uL (ref 0.7–4.0)
MCH: 26.8 pg (ref 26.0–34.0)
MCHC: 32 g/dL (ref 30.0–36.0)
MCV: 83.8 fL (ref 80.0–100.0)
Monocytes Absolute: 0.3 10*3/uL (ref 0.1–1.0)
Monocytes Relative: 4 %
Neutro Abs: 3.8 10*3/uL (ref 1.7–7.7)
Neutrophils Relative %: 52 %
Platelets: 249 10*3/uL (ref 150–400)
RBC: 4.62 MIL/uL (ref 3.87–5.11)
RDW: 14.6 % (ref 11.5–15.5)
WBC: 7.4 10*3/uL (ref 4.0–10.5)
nRBC: 0 % (ref 0.0–0.2)
nRBC: 0 /100 WBC

## 2021-09-19 LAB — COMPREHENSIVE METABOLIC PANEL
ALT: 12 U/L (ref 0–44)
AST: 19 U/L (ref 15–41)
Albumin: 3.7 g/dL (ref 3.5–5.0)
Alkaline Phosphatase: 30 U/L — ABNORMAL LOW (ref 38–126)
Anion gap: 5 (ref 5–15)
BUN: 12 mg/dL (ref 6–20)
CO2: 24 mmol/L (ref 22–32)
Calcium: 9.4 mg/dL (ref 8.9–10.3)
Chloride: 108 mmol/L (ref 98–111)
Creatinine, Ser: 0.77 mg/dL (ref 0.44–1.00)
GFR, Estimated: >60 mL/min (ref >60)
Glucose, Bld: 89 mg/dL (ref 70–99)
Potassium: 3.9 mmol/L (ref 3.5–5.1)
Sodium: 137 mmol/L (ref 135–145)
Total Bilirubin: 0.5 mg/dL (ref 0.3–1.2)
Total Protein: 6.9 g/dL (ref 6.5–8.1)

## 2021-09-19 LAB — I-STAT BETA HCG BLOOD, ED (MC, WL, AP ONLY): I-stat hCG, quantitative: <5 m[IU]/mL (ref <5)

## 2021-09-19 LAB — LIPASE, BLOOD: Lipase: 36 U/L (ref 11–51)

## 2021-09-19 MED ORDER — LIDOCAINE HCL URETHRAL/MUCOSAL 2 % EX GEL
1.0000 "application " | Freq: Once | CUTANEOUS | Status: AC
  Administered 2021-09-19: 1 via TOPICAL
  Filled 2021-09-19: qty 11

## 2021-09-19 MED ORDER — HYDROCORTISONE (PERIANAL) 2.5 % EX CREA: 1.0000 "application " | TOPICAL_CREAM | Freq: Two times a day (BID) | CUTANEOUS | 1 refills | Status: AC

## 2021-09-19 MED ORDER — POLYETHYLENE GLYCOL 3350 17 GM/SCOOP PO POWD
17.0000 g | Freq: Every day | ORAL | 0 refills | Status: AC
Start: 2021-09-19 — End: ?

## 2021-09-19 MED ORDER — IOHEXOL 300 MG/ML  SOLN
100.0000 mL | Freq: Once | INTRAMUSCULAR | Status: AC | PRN
  Administered 2021-09-19: 100 mL via INTRAVENOUS
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
## 2021-09-19 NOTE — ED Triage Notes (Signed)
C/o hemorrhoid pain since moving to [place] 1 year ago.  States she saw PCP for same and used cream without relief. ?

## 2021-09-19 NOTE — Discharge Instructions (Signed)
?  Please report to [facility] ? ?[address] ?[place], [place] [address] ? ? ?

## 2021-09-19 NOTE — Discharge Instructions (Signed)
Your rectal discomfort is likely due to irritated hemorrhoid.  Please take MiraLAX to help soften your stool, use Anusol as needed to help aid with rectal discomfort.  You may follow-up with [facility] for further management of your condition. ?

## 2021-09-19 NOTE — ED Notes (Signed)
Patient is being discharged from the Urgent Care and sent to the Emergency Department via POV . Per Erma Pinto, patient is in need of higher level of care due to findings on evaluation. Patient is aware and verbalizes understanding of plan of care.  ?Vitals:  ? 09/19/21 1435  ?BP: 138/81  ?Pulse: 88  ?Resp: 16  ?Temp: 98.2 ?F (36.8 ?C)  ?SpO2: 98%  ?  ?

## 2021-09-19 NOTE — ED Provider Notes (Signed)
?[facility] ?Provider Note ? ? ?CSN: [id number] ?Arrival date & time: 09/19/21  1601 ? ?  ? ?History ? ?Chief Complaint  ?Patient presents with  ? Hemorrhoids  ? ? ?Gar GibbonFlavia Wesch is a [age] y.o. female. ? ?The history is provided by the patient and medical records. The history is limited by a language barrier. No language interpreter was used.  ? ?46 year old Swahili speaking female sent here from urgent care center for evaluation of rectal pain.  I was able to obtain history from patient as she is able to communicate with me.  Patient mention she has history of hemorrhoids since she had a baby back in 2003.  Sometimes hemorrhoid does flareup and causing rectal pain.  For the past month she has endorsed increasing worsening rectal pain for the past 3 days the pain has intensified.  She described pain as a throbbing squeezing sensation with rectal bleeding and rectal itchiness.  She also endorsed mild abdominal discomfort but states this is not unusual for her.  She relates she may had some constipation recently that may have triggered this discomfort.  She has been using Anusol cream but report no relief.  She went to urgent care center today for evaluation but was sent here due to the severity of her rectal pain.  She does not endorse any fever, nausea, diarrhea, dysuria, bowel or bladder incontinence or saddle anesthesia.  No recent instrumentation or trauma.  Bleeding described as trace of blood that drips into the toilet bowl when having bowel movement. ? ?Home Medications ?Prior to Admission medications   ?Medication Sig Start Date End Date Taking? Authorizing Provider  ?gabapentin (NEURONTIN) 100 MG capsule Take 1 capsule (100 mg total) by mouth at bedtime. ?Patient not taking: Reported on 10/08/2020 09/12/20 10/12/20  Rema FendtStephens, Amy J, NP  ?hydrocortisone (ANUSOL-HC) 2.5 % rectal cream Place 1 application rectally 2 (two) times daily. ?Patient not taking: Reported on 10/08/2020  09/12/20   Rema FendtStephens, Amy J, NP  ?tiZANidine (ZANAFLEX) 4 MG tablet Take 1 tablet (4 mg total) by mouth every 6 (six) hours as needed for muscle spasms. 05/04/21   Ivonne AndrewNichols, Tonya S, NP  ?   ? ?Allergies    ?Patient has no known allergies.   ? ?Review of Systems   ?Review of Systems  ?All other systems reviewed and are negative. ? ?Physical Exam ?Updated Vital Signs ?BP 111/67   Pulse 92   Temp 98.5 ?F (36.9 ?C) (Oral)   Resp 18   SpO2 95%  ?Physical Exam ?Vitals and nursing note reviewed.  ?Constitutional:   ?   General: She is not in acute distress. ?   Appearance: She is well-developed.  ?HENT:  ?   Head: Atraumatic.  ?Eyes:  ?   Conjunctiva/sclera: Conjunctivae normal.  ?Cardiovascular:  ?   Rate and Rhythm: Normal rate and regular rhythm.  ?   Pulses: Normal pulses.  ?   Heart sounds: Normal heart sounds.  ?Pulmonary:  ?   Effort: Pulmonary effort is normal.  ?Abdominal:  ?   Palpations: Abdomen is soft.  ?   Tenderness: There is no abdominal tenderness.  ?Genitourinary: ?   Comments: She prone present during exam.  No obvious visualized thrombosed hemorrhoid noted on initial exam however exquisite tenderness to digital rectal exam.  Exam is limited due to patient discomfort.  No obvious mass, anal fissure, or abscess appreciated.  Blood noted on glove. ?Musculoskeletal:  ?   Cervical back: Neck supple.  ?  Skin: ?   Findings: No rash.  ?Neurological:  ?   Mental Status: She is alert.  ?Psychiatric:     ?   Mood and Affect: Mood normal.  ? ? ?ED Results / Procedures / Treatments   ?Labs ?(all labs ordered are listed, but only abnormal results are displayed) ?Labs Reviewed  ?COMPREHENSIVE METABOLIC PANEL - Abnormal; Notable for the following components:  ?    Result Value  ? Alkaline Phosphatase 30 (*)   ? All other components within normal limits  ?CBC WITH DIFFERENTIAL/PLATELET  ?LIPASE, BLOOD  ?URINALYSIS, ROUTINE W REFLEX MICROSCOPIC  ?I-STAT BETA HCG BLOOD, ED ([facility], [facility], [facility] ONLY)  ?POC OCCULT BLOOD, ED   ? ? ?EKG ?None ? ?Radiology ?CT ABDOMEN PELVIS W CONTRAST ? ?Result Date: 09/19/2021 ?CLINICAL DATA:  Lower GI bleed.  Hemorrhoids. EXAM: CT ABDOMEN AND PELVIS WITH CONTRAST TECHNIQUE: Multidetector CT imaging of the abdomen and pelvis was performed using the standard protocol following bolus administration of intravenous contrast. RADIATION DOSE REDUCTION: This exam was performed according to the departmental dose-optimization program which includes automated exposure control, adjustment of the mA and/or kV according to patient size and/or use of iterative reconstruction technique. CONTRAST:  OMNIPAQUE IOHEXOL 300 MG/ML  SOLN COMPARISON:  None. FINDINGS: Lower chest: Lung bases are clear. Hepatobiliary: No focal liver abnormality is seen. No gallstones, gallbladder wall thickening, or biliary dilatation. Pancreas: Unremarkable. No pancreatic ductal dilatation or surrounding inflammatory changes. Spleen: Normal in size without focal abnormality. Adrenals/Urinary Tract: Adrenal glands are unremarkable. Kidneys are normal, without renal calculi, focal lesion, or hydronephrosis. Bladder is unremarkable. Stomach/Bowel: Stomach, small bowel, and colon are not abnormally distended. No wall thickening or inflammatory changes. Appendix is not visualized. Vascular/Lymphatic: No significant vascular findings are present. No enlarged abdominal or pelvic lymph nodes. Reproductive: Heterogeneous appearance of uterine myometrium suggest uterine fibroids. No abnormal adnexal masses. Other: No abdominal wall hernia or abnormality. No abdominopelvic ascites. Musculoskeletal: No acute or significant osseous findings. IMPRESSION: 1. No evidence of bowel obstruction or inflammation. 2. No acute process is indicated. 3. Heterogeneous appearance of uterine myometrium suggesting uterine fibroids. Electronically Signed   By: Burman Nieves M.D.   On: 09/19/2021 20:47   ? ?Procedures ?Procedures  ? ? ?Medications Ordered in  ED ?Medications  ?lidocaine (XYLOCAINE) 2 % jelly 1 application. (1 application. Topical Given 09/19/21 1717)  ?iohexol (OMNIPAQUE) 300 MG/ML solution 100 mL (100 mLs Intravenous Contrast Given 09/19/21 2040)  ? ? ?ED Course/ Medical Decision Making/ A&P ?  ?                        ?Medical Decision Making ?Amount and/or Complexity of Data Reviewed ?Labs: ordered. ?Radiology: ordered. ? ?Risk ?Prescription drug management. ? ? ?BP 111/67   Pulse 92   Temp 98.5 ?F (36.9 ?C) (Oral)   Resp 18   SpO2 95%  ? ?4:57 PM ?This is a 46 year old female presenting complaining of rectal pain.  She reported history of hemorrhoid and states that this pain felt somewhat similar but much more intense.  It appears that the pain has been an ongoing issue for the past month but worse in the past 3 days.  She was initially seen at urgent care center for her complaint but was sent here due to the severity of her discomfort.  On exam she does have markedly discomfort with both visualization and digital rectal exam.  Due to her discomfort, I was not able to fully  examine her rectum.  She has a fairly benign abdominal exam.  Given rectal discomfort, rectal bleeding, will consider abdominal pelvis CT scan to rule out perianal abscess, malignancy, ulcerative colitis, or other acute rectal pathology.  Will provide lido gel for comfort ? ? ?9:39 PM ?Patient received lido-gel with improvement of rectal discomfort.  Labs and imaging was independently reviewed and interpreted by me.  Labs are reassuring, CT scan of the abdomen pelvis without any acute finding.  I discussed this finding with patient and recommend outpatient follow-up with [facility] for further managements of her discomfort secondary to hemorrhoid.  Return precaution given.  Will discharge home with MiraLAX to help soften her stool, and anu-sol for symptom control. ? ?This patient presents to the ED for concern of rectal pain, this involves an extensive number of  treatment options, and is a complaint that carries with it a high risk of complications and morbidity.  The differential diagnosis includes thrombosed hemorrhoid, perianal abscess, anal fissure, malignancy, AVM, colitis, divertic

## 2021-09-19 NOTE — ED Notes (Signed)
E-signature pad unavailable at time of pt discharge. This RN discussed discharge materials with pt and answered all pt questions. Pt stated understanding of discharge material. ? ?

## 2021-09-19 NOTE — ED Triage Notes (Signed)
Hx of hemorrhoids. Was laying in floor of lobby due to pain. States it feels like she is sitting on something. States she's had these before but never like this ?

## 2021-09-21 LAB — POC OCCULT BLOOD, ED: Fecal Occult Bld: POSITIVE — AB

## 2023-12-30 NOTE — Progress Notes (Signed)
 [facility]  - Family Medicine [name]  Date of Service: 01/02/2024 Patient Name: Allison Maddox Patient DOB: [birth year]    Subjective:   Annual Exam .   HPI Patient comes in for Annual Exam  CPE: Allison Maddox presents today for complete physical exam. Patient voices complaints Yes body pain.    The patient is Divorced and has 4 children. General health since the last visit is described as body pain. Patient's last menstrual period was 12/26/2023 (approximate). Date of last pap smear:  2022.   History abnormal pap smear:  No Date of last mammogram: 01/19/2023.  Colorectal cancer screening: N/A Dental visit within the last year: No Eye exam within the last year:  Yes Hearing: normal. Current diet: No Exercise:  Yes, walking Weight concerns: Yes  Sexually active: No. Urinary incontinence: No. Last TDAP:Yes - 09/12/2020.  Immunization History  Administered Date(s) Administered  . Hep B, Unspecified 07/29/2015, 09/01/2015, 08/07/2016  . Influenza, Injectable, Quadrivalent, Preservative Free 08/18/2020, 07/02/2021  . Influenza, Unspecified 08/19/2020  . Influenza,split virus, trivalent, PF 04/15/2023  . MMR 07/29/2015, 09/01/2015  . Pfizer SARS-CoV-2 Primary Series 12+ yrs 04/09/2021, 04/30/2021  . TDAP VACCINE (BOOSTRIX,ADACEL) 7Y+ 08/18/2020, 09/12/2020  . Td (Adult), Unspecified 07/29/2015, 09/01/2015    SAFETY ELEMENTS: Seatbelts yes. Texting while driving no.  Guns/weapons secure yes. Smoke detectors in home yes. Carbon monoxide detectors in home yes . Grab bars in bathroom no.    Living will and DURABLE POWER OF ATTORNEY for healthcare directives:  No  Over the last 2 weeks how often have you had little interest or little pleasure in doing things: Not at all  Over the last 2 weeks how often have you been bothered by feeling down, depressed, or hopeless:  Not at all    Review of Systems Pertinent ROS items are noted in  HPI.  Constitutional symptoms: negative Eyes:  negative Ear, nose, throat:  negative Cardiovascular:  negative Respiratory:  negative Gastrointestinal:  negative Genitourinary:  negative Skin:  negative Neurological:  negative Musculoskeletal:  negative Psychiatric:  negative Endocrine:  negative Hematological:  negative Allergic:  negative   The following portions of the patient's history were reviewed and updated as appropriate: allergies, current medications, PMH/PSH, past social history and problem list.   Past Medical/Surgical History:   Medical History[1] Surgical History[2]  Family History:   Family History[3]  Social History:   Social History[4] Tobacco Use History[5]   Allergies:   Patient has no known allergies.  Current Medications:   Current Medications[6]   Objective:   Vital Signs BP 101/70 (BP Location: Left arm, Patient Position: Sitting)   Pulse 77   Temp 97.2 F (36.2 C) (Temporal)   Ht 1.524 m (5')   Wt 99.9 kg (220 lb 4 oz)   LMP 12/26/2023 (Approximate)   SpO2 98%   BMI 43.01 kg/m   BP Readings from Last 3 Encounters:  01/02/24 101/70  12/28/22 103/66   Wt Readings from Last 3 Encounters:  01/02/24 99.9 kg (220 lb 4 oz)  12/28/22 95 kg (209 lb 6.4 oz)   Patient's last menstrual period was 12/26/2023 (approximate).  Physical Exam   Assessment/Plan:    Rescheduled due to insurance problem, not active on her [employer] Medicaid. 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  Will resch cpe with updated insurance info.     Patient verbalizes understanding and in agreement with the above plan. All questions  answered.    Medication side effects discussed with patient. Advised patient to call clinic or return for visit if these symptoms occur.   Goals of care discussed with patient including med compliance and adequate follow up.  No follow-ups on file.    This document  serves as a record of services personally performed by Santana Molt, FN.  It was created on their behalf by Seldon GORMAN Ann, CMA, a trained medical scribe, and Certified Medical Assistant (CMA). During the course of documenting the history, physical exam and medical decision making, I was functioning as a Stage manager. The creation of this record is the provider's dictation and/or activities during the visit.  Electronically signed by Seldon GORMAN Ann, CMA 12/30/2023 2:53 PM    This document was created using the aid of voice recognition Dragon dictation software.   Santana Tarry Molt, FNP       [1] Past Medical History: Diagnosis Date  . Hemorrhoids   [2] Past Surgical History: Procedure Laterality Date  . CESAREAN SECTION, LOW TRANSVERSE     x4  [3] Family History Problem Relation Name Age of Onset  . Breast cancer Neg Hx    [4] Social History Socioeconomic History  . Marital status: Divorced  Tobacco Use  . Smoking status: Never    Passive exposure: Never  . Smokeless tobacco: Never  Vaping Use  . Vaping status: Never Used  Substance and Sexual Activity  . Alcohol use: Never   Social Drivers of Health   Food Insecurity: Medium Risk (12/26/2023)   Food vital sign   . Within the past 12 months, you worried that your food would run out before you got money to buy more: Sometimes true   . Within the past 12 months, the food you bought just didn't last and you didn't have money to get more: Sometimes true  Transportation Needs: No Transportation Needs (12/26/2023)   Transportation   . In the past 12 months, has lack of reliable transportation kept you from medical appointments, meetings, work or from getting things needed for daily living? : No  Living Situation: Low Risk  (12/26/2023)   Living Situation   . What is your living situation today?: I have a steady place to live   . Think about the place you live. Do you have problems with any of the following? Choose all that  apply:: None/None on this list  [5] Social History Tobacco Use  Smoking Status Never  . Passive exposure: Never  Smokeless Tobacco Never  [6] Current Outpatient Medications  Medication Sig Dispense Refill  . hydrocortisone  (ANUSOL -HC) 2.5 % rectal cream Insert 1 Application into the rectum 2 (two) times a day as needed for hemorrhoids. 30 g 1   No current facility-administered medications for this visit.

## 2024-01-05 IMAGING — CT CT ABD-PELV W/ CM
2 of 5 series · 17 of 46 positions shown, 19 images · IV contrast (APPLIED)
Comparison: None.

CLINICAL DATA: Lower GI bleed.  Hemorrhoids.

EXAM:
CT ABDOMEN AND PELVIS WITH CONTRAST
TECHNIQUE: Multidetector CT imaging of the abdomen and pelvis was performed
using the standard protocol following bolus administration of
intravenous contrast.

[Series 3: abdomen 5.0 · axial · 0.80mm/px · z∈[+824,+1234]mm · 14 of 96 slices shown, 16 images]
[im 7/96  soft-tissue]
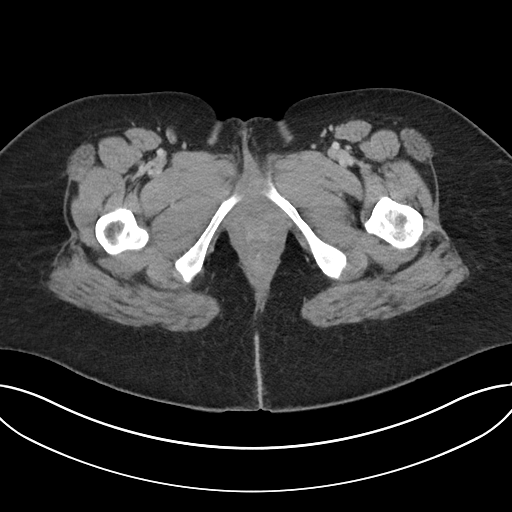
[im 7/96  bone]
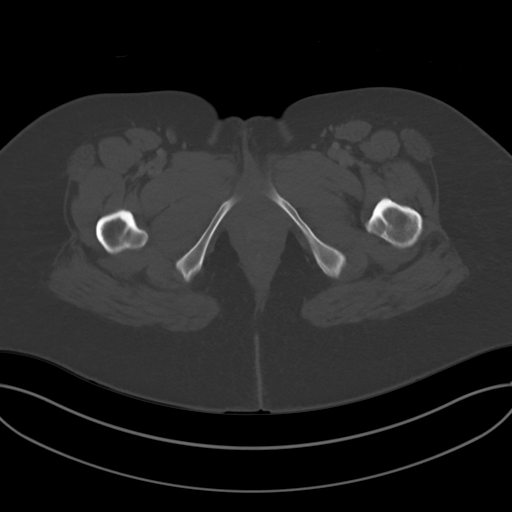
[im 13/96  soft-tissue]
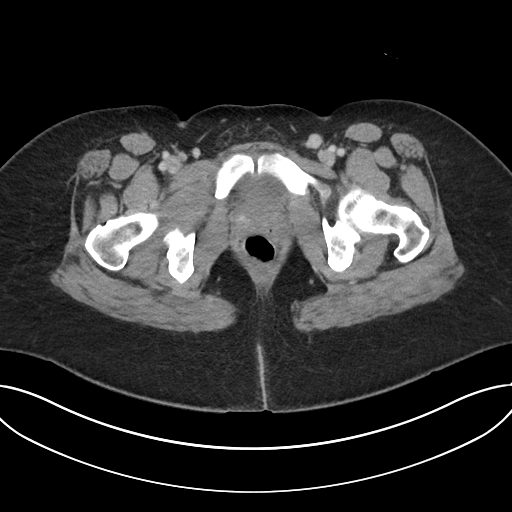
[im 20/96  soft-tissue]
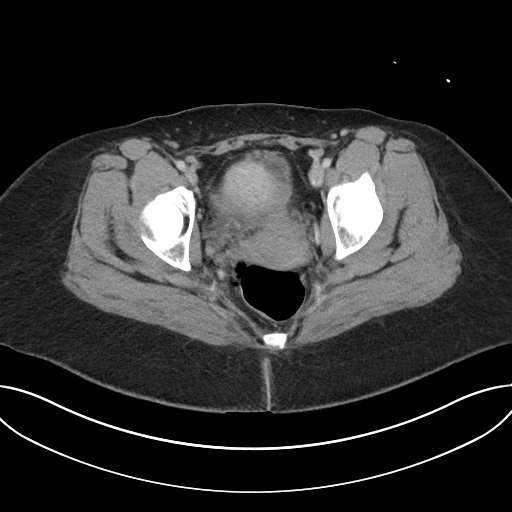
[im 26/96  soft-tissue]
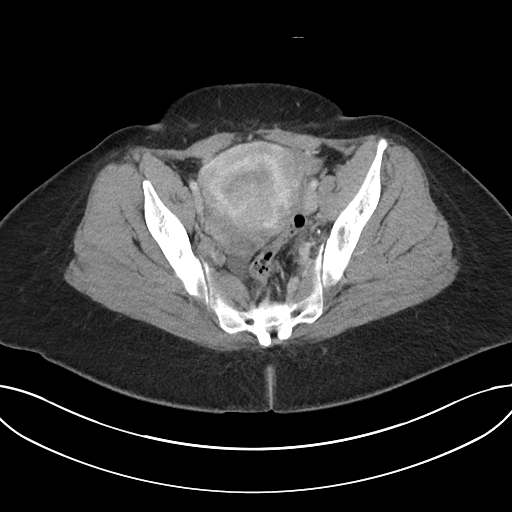
[im 32/96  soft-tissue]
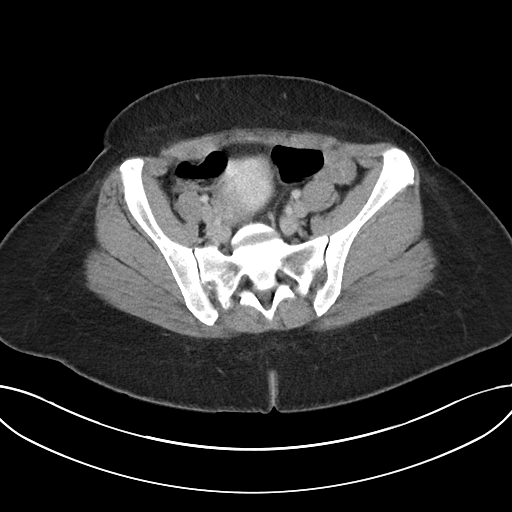
[im 39/96  soft-tissue]
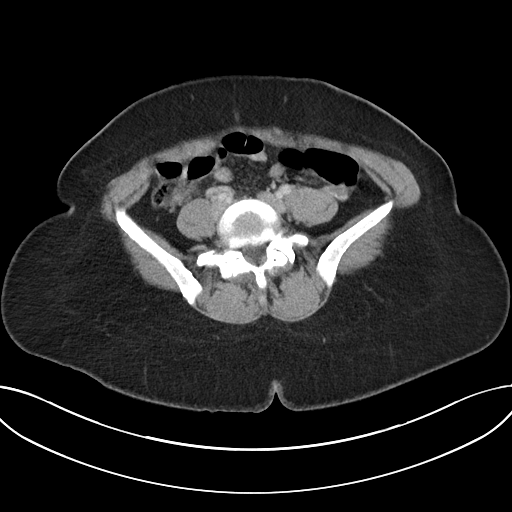
[im 45/96  soft-tissue]
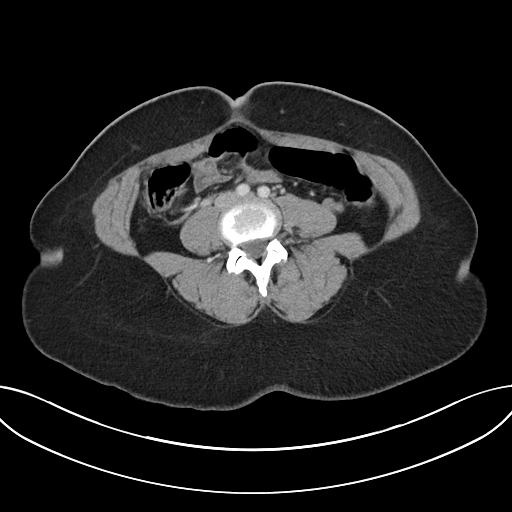
[im 51/96  soft-tissue]
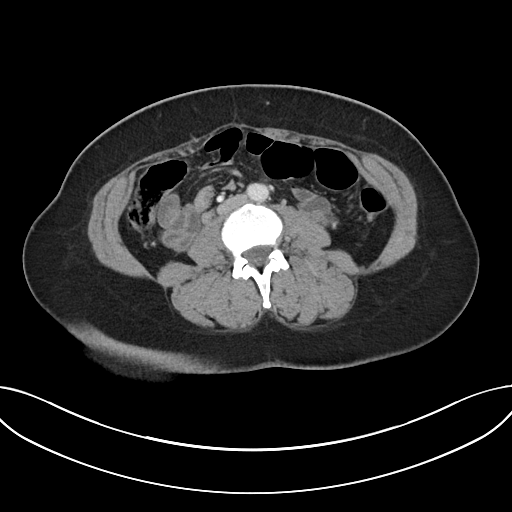
[im 58/96  soft-tissue]
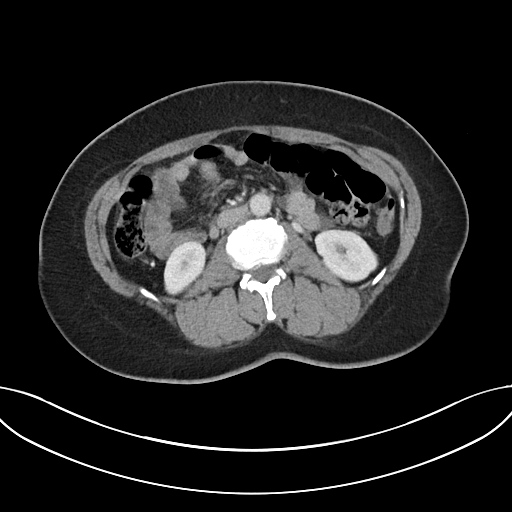
[im 58/96  bone]
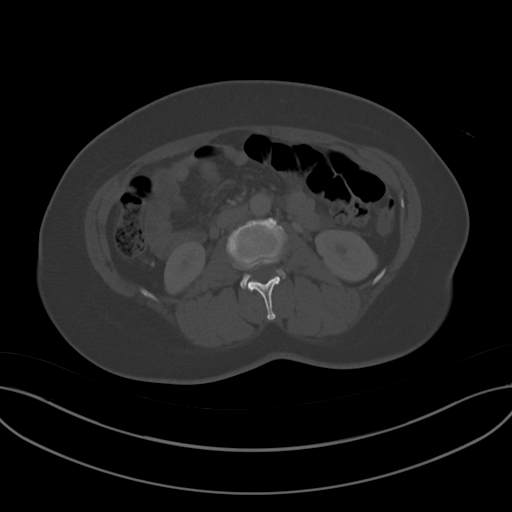
[im 64/96  soft-tissue]
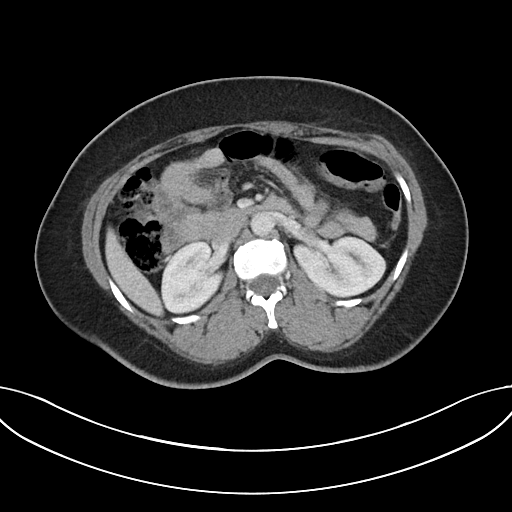
[im 70/96  soft-tissue]
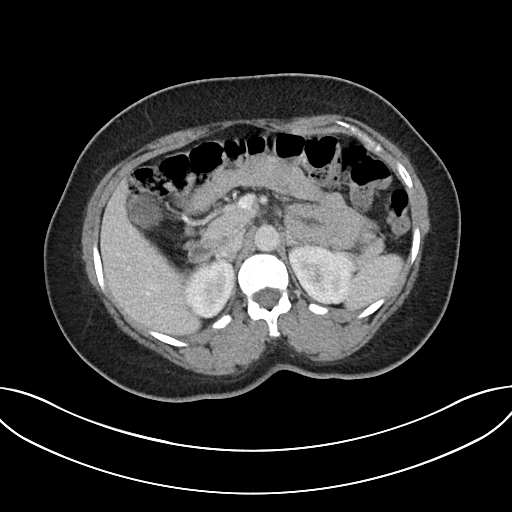
[im 77/96  soft-tissue]
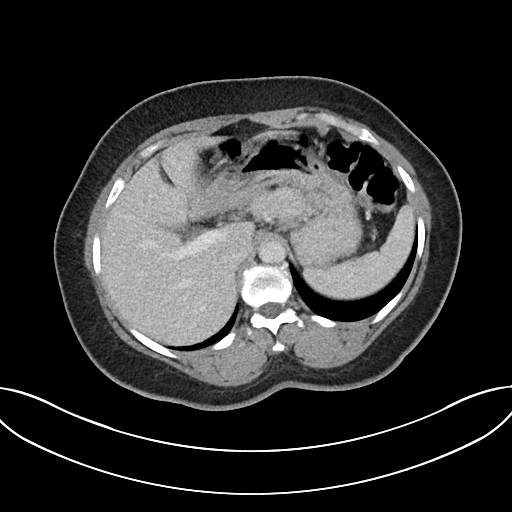
[im 83/96  soft-tissue]
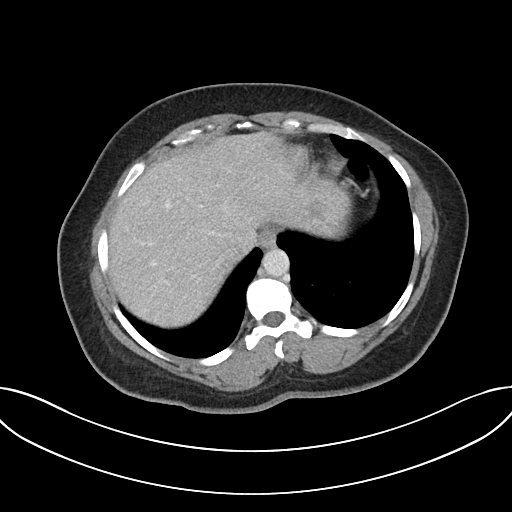
[im 89/96  soft-tissue]
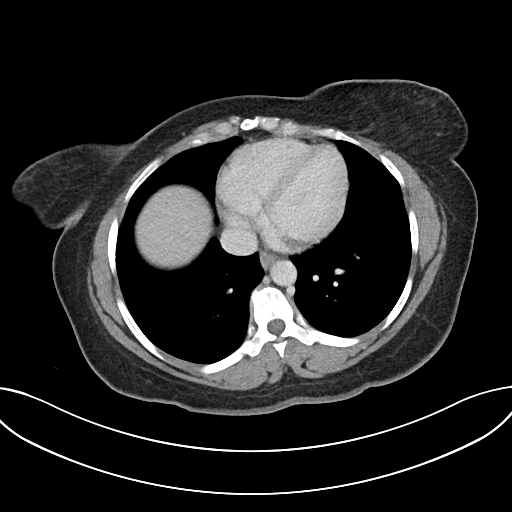

[Series 7: abdomen 3.0 mpr cor · coronal · 0.96mm/px · 3 of 97 slices shown]
[im 33/97  soft-tissue]
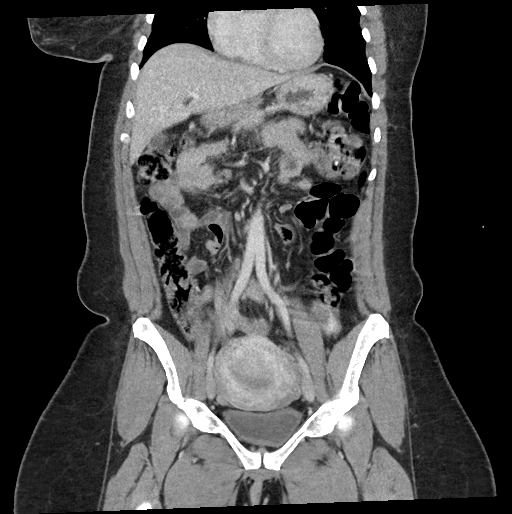
[im 43/97  soft-tissue]
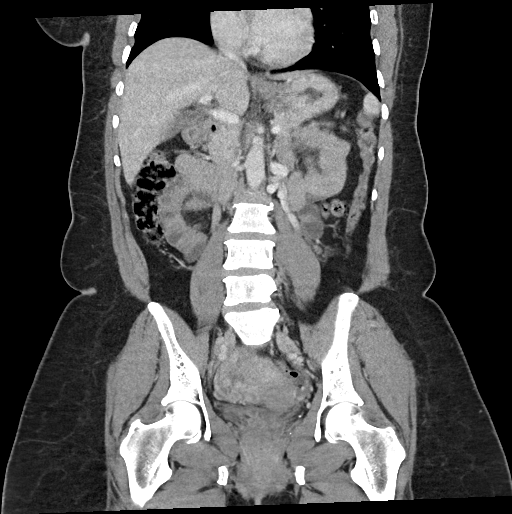
[im 54/97  soft-tissue]
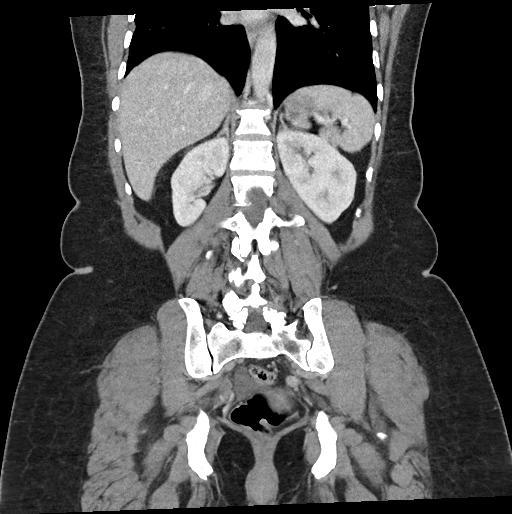

[17 of 46 positions shown; findings below may reference images not displayed]

RADIATION DOSE REDUCTION: This exam was performed according to the
departmental dose-optimization program which includes automated
exposure control, adjustment of the mA and/or kV according to
patient size and/or use of iterative reconstruction technique.

CONTRAST:  100mL OMNIPAQUE IOHEXOL 300 MG/ML  SOLN
FINDINGS: Lower chest: Lung bases are clear.

Hepatobiliary: No focal liver abnormality is seen. No gallstones,
gallbladder wall thickening, or biliary dilatation.

Pancreas: Unremarkable. No pancreatic ductal dilatation or
surrounding inflammatory changes.

Spleen: Normal in size without focal abnormality.

Adrenals/Urinary Tract: Adrenal glands are unremarkable. Kidneys are
normal, without renal calculi, focal lesion, or hydronephrosis.
Bladder is unremarkable.

Stomach/Bowel: Stomach, small bowel, and colon are not abnormally
distended. No wall thickening or inflammatory changes. Appendix is
not visualized.

Vascular/Lymphatic: No significant vascular findings are present. No
enlarged abdominal or pelvic lymph nodes.

Reproductive: Heterogeneous appearance of uterine myometrium suggest
uterine fibroids. No abnormal adnexal masses.

Other: No abdominal wall hernia or abnormality. No abdominopelvic
ascites.

Musculoskeletal: No acute or significant osseous findings.
IMPRESSION: 1. No evidence of bowel obstruction or inflammation.
2. No acute process is indicated.
3. Heterogeneous appearance of uterine myometrium suggesting uterine
fibroids.

## 2024-03-02 ENCOUNTER — Telehealth: Payer: Self-pay

## 2024-03-02 NOTE — Telephone Encounter (Signed)
 Telephoned patient at both telephone numbers using interpreter#[phone]. Patient released both calls. [facility]

## 2024-03-29 ENCOUNTER — Ambulatory Visit: Admitting: Family Medicine
